# Patient Record
Sex: Male | Born: 1945 | Race: White | Hispanic: No | Smoking: Never smoker
Health system: Southern US, Community
[De-identification: ages and names within clinical notes are randomized; demographics above are authoritative.]

## PROBLEM LIST (undated history)

## (undated) DIAGNOSIS — K5190 Ulcerative colitis, unspecified, without complications: Secondary | ICD-10-CM

---

## 2014-05-01 ENCOUNTER — Encounter (HOSPITAL_BASED_OUTPATIENT_CLINIC_OR_DEPARTMENT_OTHER): Payer: Self-pay | Admitting: Emergency Medicine

## 2014-05-01 ENCOUNTER — Emergency Department (HOSPITAL_BASED_OUTPATIENT_CLINIC_OR_DEPARTMENT_OTHER): Payer: Medicare (Managed Care)

## 2014-05-01 ENCOUNTER — Emergency Department (HOSPITAL_BASED_OUTPATIENT_CLINIC_OR_DEPARTMENT_OTHER)
Admission: EM | Admit: 2014-05-01 | Discharge: 2014-05-01 | Disposition: A | Discharge status: Home / Self Care | Payer: Medicare (Managed Care) | Attending: Emergency Medicine | Admitting: Emergency Medicine

## 2014-05-01 DIAGNOSIS — Z8719 Personal history of other diseases of the digestive system: Secondary | ICD-10-CM | POA: Insufficient documentation

## 2014-05-01 DIAGNOSIS — S0510XA Contusion of eyeball and orbital tissues, unspecified eye, initial encounter: Secondary | ICD-10-CM | POA: Insufficient documentation

## 2014-05-01 DIAGNOSIS — S0010XA Contusion of unspecified eyelid and periocular area, initial encounter: Secondary | ICD-10-CM | POA: Insufficient documentation

## 2014-05-01 DIAGNOSIS — W108XXA Fall (on) (from) other stairs and steps, initial encounter: Secondary | ICD-10-CM | POA: Diagnosis not present

## 2014-05-01 DIAGNOSIS — H1132 Conjunctival hemorrhage, left eye: Secondary | ICD-10-CM

## 2014-05-01 DIAGNOSIS — Y9389 Activity, other specified: Secondary | ICD-10-CM | POA: Diagnosis not present

## 2014-05-01 DIAGNOSIS — Y929 Unspecified place or not applicable: Secondary | ICD-10-CM | POA: Insufficient documentation

## 2014-05-01 DIAGNOSIS — Z79899 Other long term (current) drug therapy: Secondary | ICD-10-CM | POA: Diagnosis not present

## 2014-05-01 HISTORY — DX: Ulcerative colitis, unspecified, without complications: K51.90

## 2014-05-01 NOTE — ED Notes (Signed)
Reports injury to left eye. Went to MD yesterday and today. Now having double vision, MD wants CT done.

## 2014-05-01 NOTE — ED Provider Notes (Signed)
CSN: [id number]     Arrival date & time 05/01/14  1803 History   First MD Initiated Contact with Patient 05/01/14 1827     This chart was scribed for No att. providers found by Arlan Organ, ED Scribe. This patient was seen in room MHOTF/OTF and the patient's care was started 11:57 PM.   Chief Complaint  Patient presents with  . Eye Injury   The history is provided by the patient. No language interpreter was used.    HPI Comments: Brett Richardson is a 68 y.o. male who presents to the Emergency Department complaining of a L eye injury sustained yesterday. He now reports double vision when looking up. Pt states he was patching up a wall when he slipped down some stairs carrying a hawk resulting in the equipment hitting him in the eye. Pt states he was seen by his eye doctor yesterday and today for same complaint. L eye was treated with some eye drops at both visits. Optometrist advised pt to come to ED for a CT scan to rule out a fracture. He denies any blurred vision at this time.  Past Medical History  Diagnosis Date  . Ulcerative colitis    History reviewed. No pertinent past surgical history. No family history on file. History  Substance Use Topics  . Smoking status: Never Smoker   . Smokeless tobacco: Not on file  . Alcohol Use: Yes     Comment: occ    Review of Systems  Eyes: Negative for photophobia and visual disturbance.    A complete 10 system review of systems was obtained and all systems are negative except as noted in the HPI and PMH.    Allergies  Review of patient's allergies indicates no known allergies.  Home Medications   Prior to Admission medications   Medication Sig Start Date End Date Taking? Authorizing Provider  losartan (COZAAR) 100 MG tablet Take 100 mg by mouth daily.   Yes Historical Provider, MD  simvastatin (ZOCOR) 10 MG tablet Take 10 mg by mouth daily.   Yes Historical Provider, MD  sulfaSALAzine (AZULFIDINE) 500 MG tablet Take 500 mg by mouth 4  (four) times daily.   Yes Historical Provider, MD   Triage Vitals: BP 140/82  Pulse 63  Temp(Src) 98.8 F (37.1 C) (Oral)  Resp 18  Ht  (1.651 m)  Wt 200 lb (90.719 kg)  BMI 33.28 kg/m2  SpO2 97%   Physical Exam  Nursing note and vitals reviewed. Constitutional: He appears well-developed and well-nourished. No distress.  HENT:  Head: Normocephalic and atraumatic. Head is without raccoon's eyes and without Battle's sign.  Right Ear: External ear normal.  Left Ear: External ear normal.  No periorbital tenderness  Eyes: EOM are normal. Right eye exhibits no discharge. Left eye exhibits no discharge. Right conjunctiva is not injected. Right conjunctiva has no hemorrhage. Left conjunctiva is injected. Left conjunctiva has a hemorrhage. No scleral icterus. Right pupil is not reactive. Left pupil is not reactive. Left pupil is round. Pupils are equal.  Pt had eye drops placed at optometrist office No hyphema Disc margins sharp  Neck: Neck supple. No tracheal deviation present.  Cardiovascular: Normal rate.   Pulmonary/Chest: Effort normal. No stridor. No respiratory distress.  Musculoskeletal: He exhibits no edema.  Neurological: He is alert. Cranial nerve deficit: no gross deficits.  Skin: Skin is warm and dry. No rash noted.  Psychiatric: He has a normal mood and affect.    ED Course  Procedures (  including critical care time)  DIAGNOSTIC STUDIES: Oxygen Saturation is 98% on RA, Normal by my interpretation.    COORDINATION OF CARE: 11:57 PM- Will order CT orbits w/o CM. Discussed treatment plan with pt at bedside and pt agreed to plan.     Labs Review Labs Reviewed - No data to display  Imaging Review Ct Orbitss W/o Cm  05/01/2014   CLINICAL DATA:  Left eye injury with pain and bleeding.  EXAM: CT ORBITS WITHOUT CONTRAST  TECHNIQUE: Multidetector CT imaging of the orbits was performed following the standard protocol without intravenous contrast.  COMPARISON:  None.   FINDINGS: The globes retained their spherical shape and are grossly unremarkable.  There is no evidence of postseptal or intraconal abnormality.  No fracture, subluxation or dislocation identified. The orbits are unremarkable.  The visualized paranasal sinuses, mastoid air cells and middle/ inner ears are clear.  No focal bony lesions are present.  The visualized portions of the brain are unremarkable.  IMPRESSION: Unremarkable exam.   Electronically Signed   By: Laveda Abbe M.D.   On: 05/01/2014 20:17     MDM   Final diagnoses:  Subconjunctival hematoma, left   Patient's CT scan does not show evidence of an orbital blowout fracture. His pupil is dilated here in the emergency department however he did have drops placed in his eye at the optometrist office. The patient mentions issues with diplopia. He has not had any pain or blurred vision making an iritis unlikely. The patient is stable to followup with an ophthalmologist. I provided a referral. Patient will followup with the ophthalmologist or his optometrist.  I personally performed the services described in this documentation, which was scribed in my presence. The recorded information has been reviewed and is accurate.    Linwood Dibbles, MD 05/01/14 [phone]

## 2014-05-01 NOTE — Discharge Instructions (Signed)
Subconjunctival Hemorrhage °A subconjunctival hemorrhage is a bright red patch covering a portion of the white of the eye. The white part of the eye is called the sclera, and it is covered by a thin membrane called the conjunctiva. This membrane is clear, except for tiny blood vessels that you can see with the naked eye. When your eye is irritated or inflamed and becomes red, it is because the vessels in the conjunctiva are swollen. °Sometimes, a blood vessel in the conjunctiva can break and bleed. When this occurs, the blood builds up between the conjunctiva and the sclera, and spreads out to create a red area. The red spot may be very small at first. It may then spread to cover a larger part of the surface of the eye, or even all of the visible white part of the eye. °In almost all cases, the blood will go away and the eye will become white again. Before completely dissolving, however, the red area may spread. It may also become brownish-yellow in color before going away. If a lot of blood collects under the conjunctiva, it may look like a bulge on the surface of the eye. This looks scary, but it will also eventually flatten out and go away. Subconjunctival hemorrhages do not cause pain, but if swollen, may cause a feeling of irritation. There is no effect on vision.  °CAUSES  °· The most common cause is mild trauma (rubbing the eye, irritation). °· Subconjunctival hemorrhages can happen because of coughing or straining (lifting heavy objects), vomiting, or sneezing. °· In some cases, your doctor may want to check your blood pressure. High blood pressure can also cause a subconjunctival hemorrhage. °· Severe trauma or blunt injuries. °· Diseases that affect blood clotting (hemophilia, leukemia). °· Abnormalities of blood vessels behind the eye (carotid cavernous sinus fistula). °· Tumors behind the eye. °· Certain drugs (aspirin, Coumadin, heparin). °· Recent eye surgery. °HOME CARE INSTRUCTIONS  °· Do not worry  about the appearance of your eye. You may continue your usual activities. °· Often, follow-up is not necessary. °SEEK MEDICAL CARE IF:  °· Your eye becomes painful. °· The bleeding does not disappear within 3 weeks. °· Bleeding occurs elsewhere, for example, under the skin, in the mouth, or in the other eye. °· You have recurring subconjunctival hemorrhages. °SEEK IMMEDIATE MEDICAL CARE IF:  °· Your vision changes or you have difficulty seeing. °· You develop a severe headache, persistent vomiting, confusion, or abnormal drowsiness (lethargy). °· Your eye seems to bulge or protrude from the eye socket. °· You notice the sudden appearance of bruises or have spontaneous bleeding elsewhere on your body. °Document Released: 08/01/2005 Document Revised: 12/16/2013 Document Reviewed: 06/29/2009 °[employer]® Patient Information ©2015 [employer]. This information is not intended to replace advice given to you by your health care provider. Make sure you discuss any questions you have with your health care provider. ° °

## 2015-06-26 IMAGING — CT CT ORBITS W/O CM
3 of 5 series · 15 of 47 positions shown, 18 images · non-contrast
Comparison: None.

CLINICAL DATA: Left eye injury with pain and bleeding.

EXAM:
CT ORBITS WITHOUT CONTRAST
TECHNIQUE: Multidetector CT imaging of the orbits was performed following the
standard protocol without intravenous contrast.

[Series 3: orbits 2.0 h30s st · axial · 0.32mm/px · z∈[-106,-24]mm · 10 of 49 slices shown, 13 images]
[im 4/49  brain]
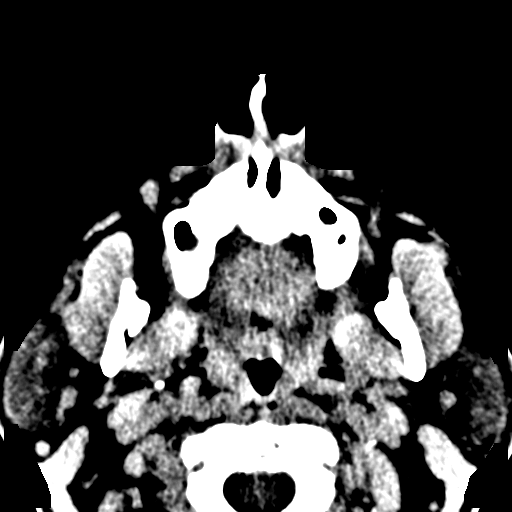
[im 4/49  bone]
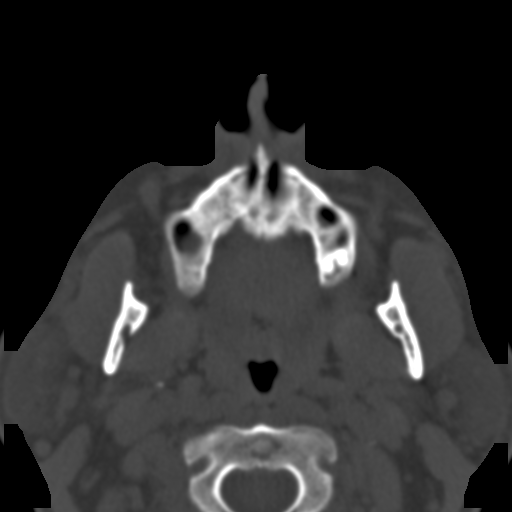
[im 9/49  bone]
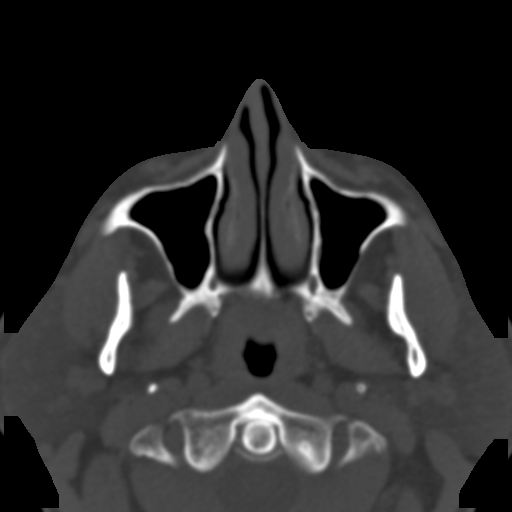
[im 14/49  bone]
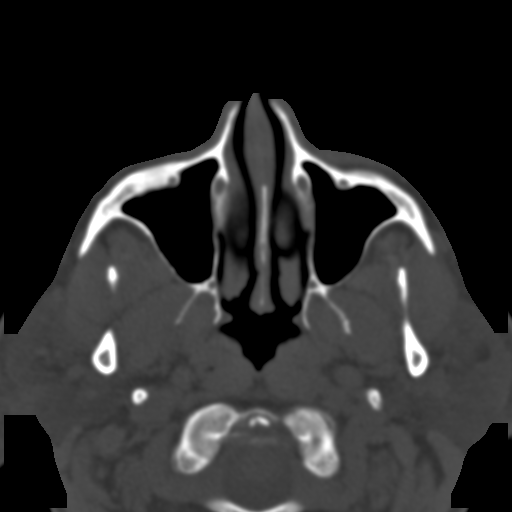
[im 17/49  bone]
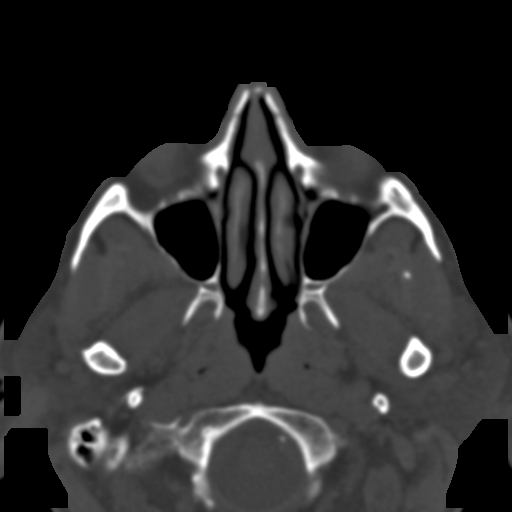
[im 22/49  brain]
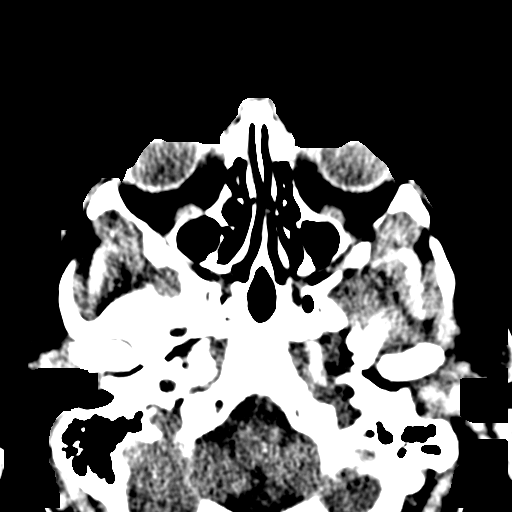
[im 22/49  bone]
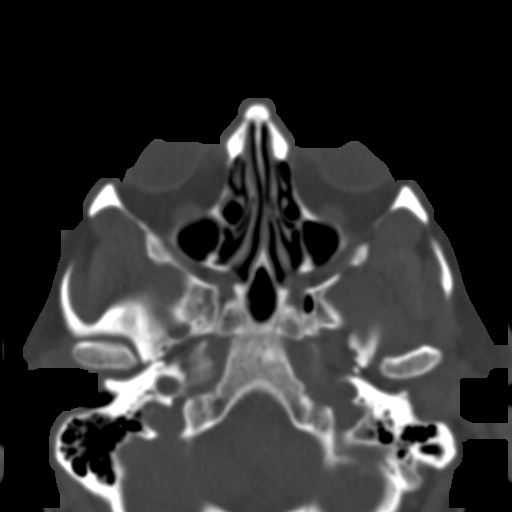
[im 27/49  bone]
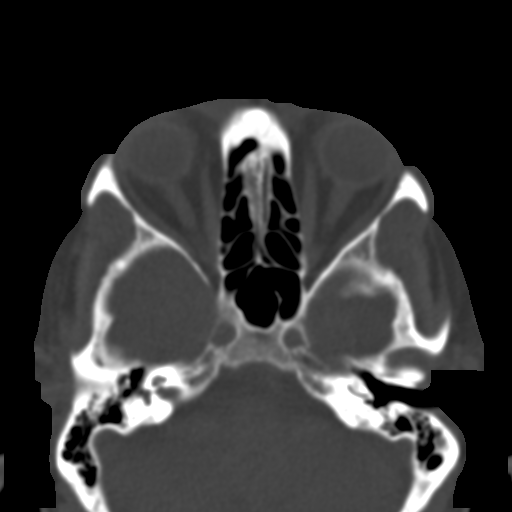
[im 32/49  bone]
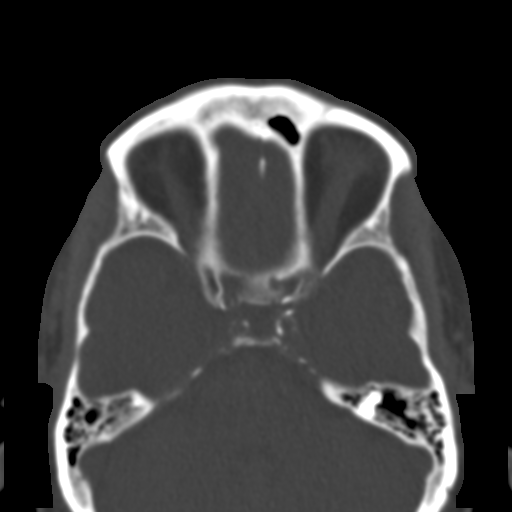
[im 37/49  bone]
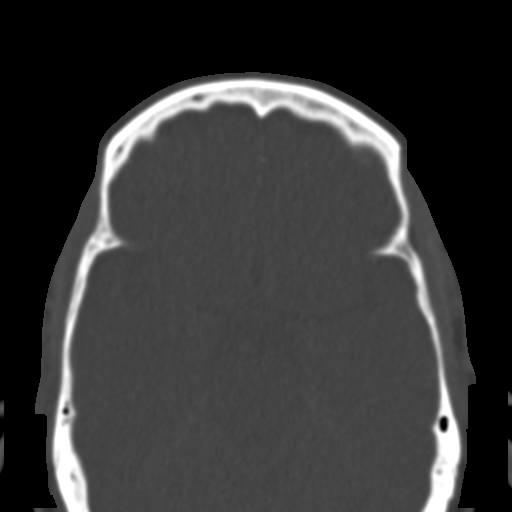
[im 40/49  brain]
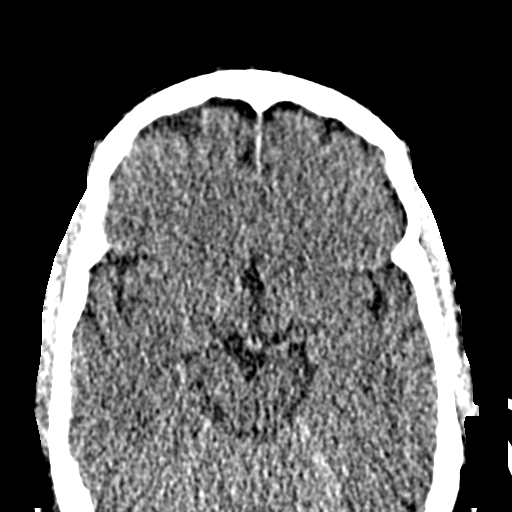
[im 40/49  bone]
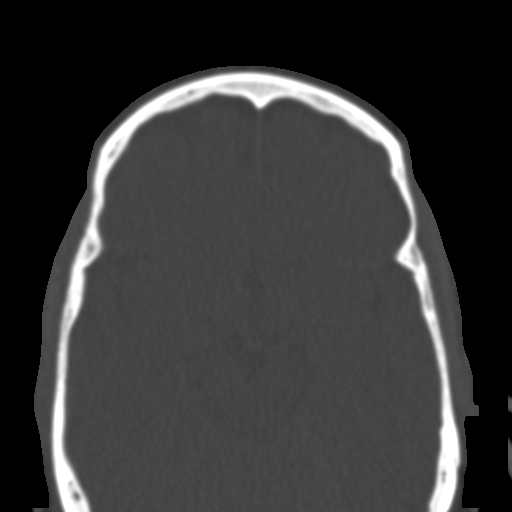
[im 45/49  bone]
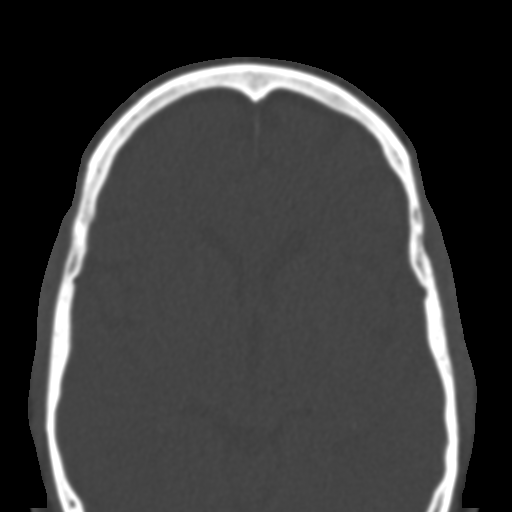

[Series 6: orbits 2.0 coronal · coronal · 0.20mm/px · 3 of 57 slices shown]
[im 15/57  bone]
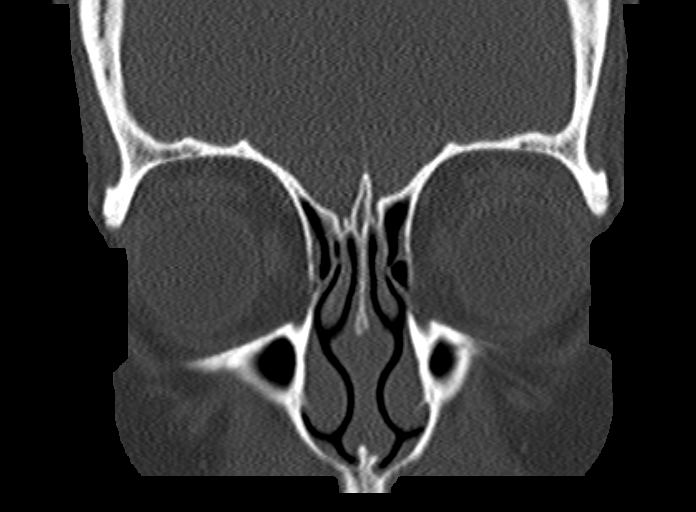
[im 29/57  bone]
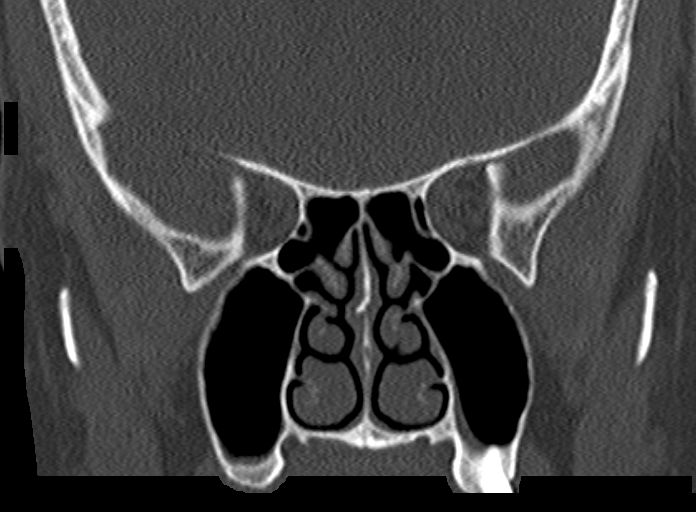
[im 43/57  bone]
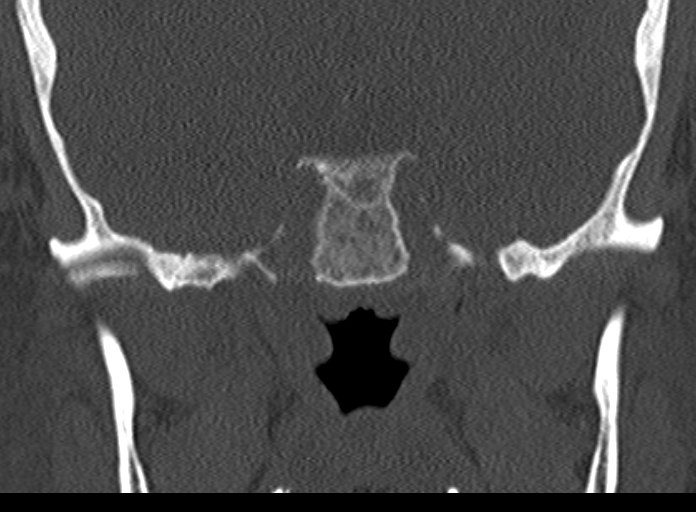

[Series 9: orbits 2.0 sagittal · sagittal · 0.22mm/px · 2 of 77 slices shown]
[im 26/77  bone]
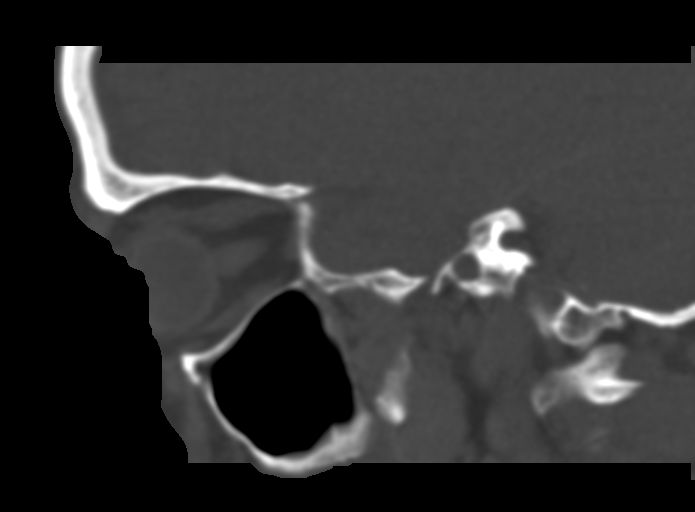
[im 51/77  bone]
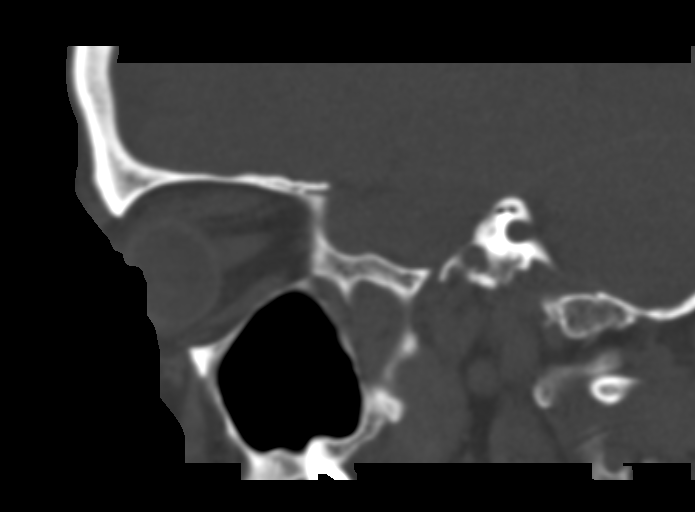

[15 of 47 positions shown; findings below may reference images not displayed]

FINDINGS: The globes retained their spherical shape and are grossly
unremarkable.

There is no evidence of postseptal or intraconal abnormality.

No fracture, subluxation or dislocation identified. The orbits are
unremarkable.

The visualized paranasal sinuses, mastoid air cells and middle/
inner ears are clear.

No focal bony lesions are present.

The visualized portions of the brain are unremarkable.
IMPRESSION: Unremarkable exam.
